# Patient Record
Sex: Female | Born: 1970 | Hispanic: No | Smoking: Current every day smoker
Health system: Southern US, Community
[De-identification: ages and names within clinical notes are randomized; demographics above are authoritative.]

## PROBLEM LIST (undated history)

## (undated) DIAGNOSIS — Z30430 Encounter for insertion of intrauterine contraceptive device: Secondary | ICD-10-CM

## (undated) DIAGNOSIS — F17200 Nicotine dependence, unspecified, uncomplicated: Secondary | ICD-10-CM

## (undated) HISTORY — PX: INTRAUTERINE DEVICE (IUD) INSERTION: SHX5877

## (undated) HISTORY — DX: Encounter for insertion of intrauterine contraceptive device: Z30.430

## (undated) HISTORY — DX: Nicotine dependence, unspecified, uncomplicated: F17.200

## (undated) HISTORY — PX: OTHER SURGICAL HISTORY: SHX169

---

## 2006-03-19 ENCOUNTER — Other Ambulatory Visit: Admission: RE | Admit: 2006-03-19 | Discharge: 2006-03-19 | Payer: Self-pay | Admitting: Obstetrics and Gynecology

## 2007-02-08 ENCOUNTER — Other Ambulatory Visit: Admission: RE | Admit: 2007-02-08 | Discharge: 2007-02-08 | Payer: Self-pay | Admitting: Obstetrics & Gynecology

## 2008-02-09 ENCOUNTER — Other Ambulatory Visit: Admission: RE | Admit: 2008-02-09 | Discharge: 2008-02-09 | Payer: Self-pay | Admitting: Obstetrics and Gynecology

## 2009-02-13 ENCOUNTER — Other Ambulatory Visit: Admission: RE | Admit: 2009-02-13 | Discharge: 2009-02-13 | Payer: Self-pay | Admitting: Obstetrics & Gynecology

## 2011-12-12 DIAGNOSIS — Z30430 Encounter for insertion of intrauterine contraceptive device: Secondary | ICD-10-CM

## 2011-12-12 HISTORY — DX: Encounter for insertion of intrauterine contraceptive device: Z30.430

## 2013-05-02 ENCOUNTER — Ambulatory Visit (INDEPENDENT_AMBULATORY_CARE_PROVIDER_SITE_OTHER): Payer: No Typology Code available for payment source | Admitting: Gynecology

## 2013-05-02 ENCOUNTER — Encounter: Payer: Self-pay | Admitting: Nurse Practitioner

## 2013-05-02 VITALS — BP 104/68 | Resp 14 | Wt 173.0 lb

## 2013-05-02 DIAGNOSIS — F17200 Nicotine dependence, unspecified, uncomplicated: Secondary | ICD-10-CM | POA: Insufficient documentation

## 2013-05-02 DIAGNOSIS — N949 Unspecified condition associated with female genital organs and menstrual cycle: Secondary | ICD-10-CM

## 2013-05-02 DIAGNOSIS — B373 Candidiasis of vulva and vagina: Secondary | ICD-10-CM

## 2013-05-02 DIAGNOSIS — Z975 Presence of (intrauterine) contraceptive device: Secondary | ICD-10-CM

## 2013-05-02 DIAGNOSIS — R102 Pelvic and perineal pain: Secondary | ICD-10-CM

## 2013-05-02 LAB — POCT URINALYSIS DIPSTICK

## 2013-05-02 LAB — POCT WET PREP (WET MOUNT): Clue Cells Wet Prep Whiff POC: NEGATIVE

## 2013-05-02 MED ORDER — FLUCONAZOLE 150 MG PO TABS: 150.0000 mg | ORAL_TABLET | Freq: Once | ORAL | Status: DC

## 2013-05-02 NOTE — Progress Notes (Signed)
Subjective:     Patient ID: Natasha Meza, female   DOB: 10-Dec-1970, 42 y.o.   MRN: [id number]  HPI Comments: Pt present with 2 month history of itching, burning and urinary frequency.  Pt reports treated with water, cranberry supplements and ibuprofen, symptoms would wax and wane.  Pt reports generalized malaise and fatigue.  Pt reports pain with wiping around urethra.  Denies hematuria.  Reports vaginal discharge-yellowish, thick, but no odor.  Reports pain with deep penetration and vaginal dryness.  Pt with Mirena in place, no menses.  Current partner 14m, small break in between, no condoms.      Review of Systems Per hpi    Objective:   Physical Exam  Constitutional: She is oriented to person, place, and time. She appears well-developed and well-nourished.  Genitourinary:     Neurological: She is alert and oriented to person, place, and time.   Pelvic: External genitalia:  See image, white discharge noted on minora              Urethra:  normal appearing urethra with no masses, tenderness or lesions              Bartholins and Skenes: normal                 Vagina:scant thick white discharge              Cervix: normal appearance, strings seen                    Bimanual Exam:  Uterus:  uterus is normal size, shape, consistency and nontender                                      Adnexa: normal adnexa in size, nontender and no masses                                     WP done                                  Assessment:     Yeast vaginitis, vulvitis     Plan:     Fluconazole and topical antifungals Pelvic hygiene reviewed rto in 2w

## 2013-05-02 NOTE — Addendum Note (Signed)
Addended by: Lorraine Lax on: 05/02/2013 03:35 PM   Modules accepted: Orders

## 2013-05-02 NOTE — Patient Instructions (Addendum)
Avoid harsh detergents and rough tp cotton underwear, no thongs, cetaphil liquid-facial soap-no underwear at night.  clomiitrazole externally, do sitz baths with aveeno pour over, blow dry on cool setting after bathing

## 2013-05-04 ENCOUNTER — Telehealth: Payer: Self-pay | Admitting: *Deleted

## 2013-05-04 NOTE — Telephone Encounter (Signed)
Left Message To Call Back  

## 2013-05-04 NOTE — Telephone Encounter (Signed)
Pt notified (see lab)

## 2013-05-04 NOTE — Telephone Encounter (Signed)
Message copied by Lorraine Lax on Wed May 04, 2013 11:43 AM ------      Message from: Douglass Rivers      Created: Wed May 04, 2013  8:34 AM       No uti ------

## 2013-05-04 NOTE — Telephone Encounter (Signed)
Returning you call

## 2013-05-16 ENCOUNTER — Ambulatory Visit: Payer: No Typology Code available for payment source | Admitting: Gynecology

## 2013-12-12 ENCOUNTER — Encounter: Payer: Self-pay | Admitting: Certified Nurse Midwife

## 2013-12-12 ENCOUNTER — Ambulatory Visit (INDEPENDENT_AMBULATORY_CARE_PROVIDER_SITE_OTHER): Payer: No Typology Code available for payment source | Admitting: Certified Nurse Midwife

## 2013-12-12 VITALS — BP 104/60 | HR 68 | Resp 16 | Ht 64.75 in | Wt 166.0 lb

## 2013-12-12 DIAGNOSIS — B373 Candidiasis of vulva and vagina: Secondary | ICD-10-CM

## 2013-12-12 DIAGNOSIS — Z01419 Encounter for gynecological examination (general) (routine) without abnormal findings: Secondary | ICD-10-CM

## 2013-12-12 DIAGNOSIS — B3731 Acute candidiasis of vulva and vagina: Secondary | ICD-10-CM

## 2013-12-12 DIAGNOSIS — Z0000 Encounter for general adult medical examination without abnormal findings: Secondary | ICD-10-CM

## 2013-12-12 LAB — POCT URINALYSIS DIPSTICK
Bilirubin, UA: NEGATIVE
GLUCOSE UA: NEGATIVE
Ketones, UA: NEGATIVE
Leukocytes, UA: NEGATIVE
NITRITE UA: NEGATIVE
PH UA: 5
PROTEIN UA: NEGATIVE
RBC UA: NEGATIVE
UROBILINOGEN UA: NEGATIVE

## 2013-12-12 MED ORDER — NYSTATIN-TRIAMCINOLONE 100000-0.1 UNIT/GM-% EX CREA
1.0000 | TOPICAL_CREAM | Freq: Two times a day (BID) | CUTANEOUS | Status: AC
Start: 2013-12-12 — End: ?

## 2013-12-12 MED ORDER — FLUCONAZOLE 150 MG PO TABS: ORAL_TABLET | ORAL | Status: AC

## 2013-12-12 NOTE — Patient Instructions (Addendum)
EXERCISE AND DIET:  We recommended that you start or continue a regular exercise program for good health. Regular exercise means any activity that makes your heart beat faster and makes you sweat.  We recommend exercising at least 30 minutes per day at least 3 days a week, preferably 4 or 5.  We also recommend a diet low in fat and sugar.  Inactivity, poor dietary choices and obesity can cause diabetes, heart attack, stroke, and kidney damage, among others.    ALCOHOL AND SMOKING:  Women should limit their alcohol intake to no more than 7 drinks/beers/glasses of wine (combined, not each!) per week. Moderation of alcohol intake to this level decreases your risk of breast cancer and liver damage. And of course, no recreational drugs are part of a healthy lifestyle.  And absolutely no smoking or even second hand smoke. Most people know smoking can cause heart and lung diseases, but did you know it also contributes to weakening of your bones? Aging of your skin?  Yellowing of your teeth and nails?  CALCIUM AND VITAMIN D:  Adequate intake of calcium and Vitamin D are recommended.  The recommendations for exact amounts of these supplements seem to change often, but generally speaking 600 mg of calcium (either carbonate or citrate) and 800 units of Vitamin D per day seems prudent. Certain women may benefit from higher intake of Vitamin D.  If you are among these women, your doctor will have told you during your visit.    PAP SMEARS:  Pap smears, to check for cervical cancer or precancers,  have traditionally been done yearly, although recent scientific advances have shown that most women can have pap smears less often.  However, every woman still should have a physical exam from her gynecologist every year. It will include a breast check, inspection of the vulva and vagina to check for abnormal growths or skin changes, a visual exam of the cervix, and then an exam to evaluate the size and shape of the uterus and  ovaries.  And after 42 years of age, a rectal exam is indicated to check for rectal cancers. We will also provide age appropriate advice regarding health maintenance, like when you should have certain vaccines, screening for sexually transmitted diseases, bone density testing, colonoscopy, mammograms, etc.   MAMMOGRAMS:  All women over 40 years old should have a yearly mammogram. Many facilities now offer a "3D" mammogram, which may cost around $50 extra out of pocket. If possible,  we recommend you accept the option to have the 3D mammogram performed.  It both reduces the number of women who will be called back for extra views which then turn out to be normal, and it is better than the routine mammogram at detecting truly abnormal areas.    COLONOSCOPY:  Colonoscopy to screen for colon cancer is recommended for all women at age 50.  We know, you hate the idea of the prep.  We agree, BUT, having colon cancer and not knowing it is worse!!  Colon cancer so often starts as a polyp that can be seen and removed at colonscopy, which can quite literally save your life!  And if your first colonoscopy is normal and you have no family history of colon cancer, most women don't have to have it again for 10 years.  Once every ten years, you can do something that may end up saving your life, right?  We will be happy to help you get it scheduled when you are ready.    Be sure to check your insurance coverage so you understand how much it will cost.  It may be covered as a preventative service at no cost, but you should check your particular policy.     Yeast Infection of the Skin Some yeast on the skin is normal, but sometimes it causes an infection. If you have a yeast infection, it shows up as white or light brown patches on brown skin. You can see it better in the summer on tan skin. It causes light-colored holes in your suntan. It can happen on any area of the body. This cannot be passed from person to person. HOME  CARE  Scrub your skin daily with a dandruff shampoo. Your rash may take a couple weeks to get well.  Do not scratch or itch the rash. GET HELP RIGHT AWAY IF:   You get another infection from scratching. The skin may get warm, red, and may ooze fluid.  The infection does not seem to be getting better. MAKE SURE YOU:  Understand these instructions.  Will watch your condition.  Will get help right away if you are not doing well or get worse. Document Released: 10/30/2008 Document Revised: 02/09/2012 Document Reviewed: 10/30/2008 [employer] Patient Information 2014 [employer].  

## 2013-12-12 NOTE — Progress Notes (Signed)
43 y.o. G13P0102 Divorced Caucasian Fe here for annual exam. Periods occasional scant spotting with IUD. No cramping with IUD. Complaining of breast tenderness at times, but no skin change or nipple discharge. Patient drinks 2 cups of caffeine/day only.. No partner change or STD screening needed. Complaining of vaginal itching with clear scant discharge, used new body wash one week ago which is when symptoms began. Denies rash or bumps. Denies odor or bleeding with sexual activity. Sees urgent care if needed. Declines any labs today. No other health issues.  Patient's last menstrual period was 12/02/2011.          Sexually active: yes  The current method of family planning is IUD.    Exercising: no  exercise Smoker:  yes  Health Maintenance: Pap:  10-19-12 neg MMG:  none Colonoscopy:  none BMD:   none TDaP:  2011 Labs: Poct urine-neg Self breast exam: done occ   reports that she has been smoking.  She does not have any smokeless tobacco history on file. She reports that she drinks alcohol. She reports that she does not use illicit drugs.  Past Medical History  Diagnosis Date  . Smoker   . Encounter for insertion of mirena IUD 12/12/11    Past Surgical History  Procedure Laterality Date  . Intrauterine device (iud) insertion      mirena inserted 12-12-11 due out 12-11-16    Current Outpatient Prescriptions  Medication Sig Dispense Refill  . levonorgestrel (MIRENA) 20 MCG/24HR IUD 1 each by Intrauterine route once.       No current facility-administered medications for this visit.    Family History  Problem Relation Age of Onset  . Diabetes Maternal Grandfather   . Heart disease Maternal Grandfather     quadruple bypass  . Stroke Maternal Grandfather   . Lung cancer Paternal Grandmother     ROS:  Pertinent items are noted in HPI.  Otherwise, a comprehensive ROS was negative.  Exam:   BP 104/60  Pulse 68  Resp 16  Ht 5' 4.75" (1.645 m)  Wt 166 lb (75.297 kg)  BMI 27.83  kg/m2  LMP 12/02/2011 Height: 5' 4.75" (164.5 cm)  Ht Readings from Last 3 Encounters:  12/12/13 5' 4.75" (1.645 m)    General appearance: alert, cooperative and appears stated age Head: Normocephalic, without obvious abnormality, atraumatic Neck: no adenopathy, supple, symmetrical, trachea midline and thyroid normal to inspection and palpation Lungs: clear to auscultation bilaterally Breasts: normal appearance, no masses or tenderness, No nipple retraction or dimpling, No nipple discharge or bleeding, No axillary or supraclavicular adenopathy, non tender to touch Heart: regular rate and rhythm Abdomen: soft, non-tender; no masses,  no organomegaly Extremities: extremities normal, atraumatic, no cyanosis or edema Skin: Skin color, texture, turgor normal. No rashes or lesions Lymph nodes: Cervical, supraclavicular, and axillary nodes normal. No abnormal inguinal nodes palpated Neurologic: Grossly normal   Pelvic: External genitalia:  no lesions, increase pink with scaling and white exudate, no lesions. Wet prep taken              Urethra:  normal appearing urethra with no masses, tenderness or lesions              Bartholin's and Skene's: normal                 Vagina: normal appearing vagina with normal color and scant white discharge, no lesions ph 4.0, wet prep taken  Cervix: normal, non tender, IUD string noted              Pap taken: yes Bimanual Exam:  Uterus:  normal size, contour, position, consistency, mobility, non-tender and anteverted              Adnexa: normal adnexa and no mass, fullness, tenderness               Rectovaginal: Confirms               Anus:  normal sphincter tone, no lesions  Wet Prep: positive for yeast vaginally and on vulva area  A:  Well Woman with normal exam  Contraception Mirena IUD   Yeast vaginitis/vulvitis  Breast Tenderness normal breast exam, mammogram due, probably ovulatory or menstrual related  P:   Reviewed health and  wellness pertinent to exam  Removal Due 12/11/16, aware of warning signs with use  Reviewed findings of yeast. Comfort measures given  Rx Diflucan see order  Rx Mycolog see order  Discussed monitor occurrence and advise  Stressed importance of scheduling first mammogram, information given to schedule  Pap smear as per guidelines   mammogram pap smear taken today with HPVHR  counseled on breast self exam, mammography screening, STD prevention, adequate intake of calcium and vitamin D, diet and exercise  return annually or prn  An After Visit Summary was printed and given to the patient.

## 2013-12-13 NOTE — Progress Notes (Signed)
Reviewed personally.  M. Suzanne Aayra Hornbaker, MD.  

## 2013-12-14 LAB — IPS PAP TEST WITH HPV

## 2014-02-13 ENCOUNTER — Telehealth: Payer: Self-pay | Admitting: Certified Nurse Midwife

## 2014-02-13 NOTE — Telephone Encounter (Signed)
Patient has some billing questions

## 2014-06-14 ENCOUNTER — Encounter: Payer: Self-pay | Admitting: Gynecology

## 2014-10-02 ENCOUNTER — Encounter: Payer: Self-pay | Admitting: Certified Nurse Midwife

## 2017-08-07 ENCOUNTER — Encounter (HOSPITAL_COMMUNITY): Payer: Self-pay | Admitting: *Deleted

## 2017-08-07 ENCOUNTER — Emergency Department (HOSPITAL_COMMUNITY): Payer: Worker's Compensation

## 2017-08-07 ENCOUNTER — Emergency Department (HOSPITAL_COMMUNITY)
Admission: EM | Admit: 2017-08-07 | Discharge: 2017-08-07 | Disposition: A | Discharge status: Home / Self Care | Payer: Worker's Compensation | Attending: Emergency Medicine | Admitting: Emergency Medicine

## 2017-08-07 DIAGNOSIS — Z79899 Other long term (current) drug therapy: Secondary | ICD-10-CM | POA: Insufficient documentation

## 2017-08-07 DIAGNOSIS — Y9289 Other specified places as the place of occurrence of the external cause: Secondary | ICD-10-CM | POA: Insufficient documentation

## 2017-08-07 DIAGNOSIS — S62639A Displaced fracture of distal phalanx of unspecified finger, initial encounter for closed fracture: Secondary | ICD-10-CM | POA: Diagnosis not present

## 2017-08-07 DIAGNOSIS — W230XXA Caught, crushed, jammed, or pinched between moving objects, initial encounter: Secondary | ICD-10-CM | POA: Insufficient documentation

## 2017-08-07 DIAGNOSIS — S6992XA Unspecified injury of left wrist, hand and finger(s), initial encounter: Secondary | ICD-10-CM | POA: Diagnosis present

## 2017-08-07 DIAGNOSIS — Y990 Civilian activity done for income or pay: Secondary | ICD-10-CM | POA: Insufficient documentation

## 2017-08-07 DIAGNOSIS — F17200 Nicotine dependence, unspecified, uncomplicated: Secondary | ICD-10-CM | POA: Diagnosis not present

## 2017-08-07 DIAGNOSIS — Y939 Activity, unspecified: Secondary | ICD-10-CM | POA: Insufficient documentation

## 2017-08-07 MED ORDER — IBUPROFEN 200 MG PO TABS
600.0000 mg | ORAL_TABLET | Freq: Once | ORAL | Status: DC
  Filled 2017-08-07: qty 3

## 2017-08-07 NOTE — ED Triage Notes (Signed)
Pt reports slamming her L ring finger between a door frame and a heavy door at work.  Swelling and redness with bruising noted.

## 2017-08-07 NOTE — Discharge Instructions (Signed)
Please take Ibuprofen (Advil, motrin) and Tylenol (acetaminophen) to relieve your pain.  You may take up to 600 MG (3 pills) of normal strength ibuprofen every 8 hours as needed.  In between doses of ibuprofen you make take tylenol, up to 1,000 mg (two extra strength pills).  Do not take more than 3,000 mg tylenol in a 24 hour period.  Please check all medication labels as many medications such as pain and cold medications may contain tylenol.  Do not drink alcohol while taking these medications.  Do not take other NSAID'S while taking ibuprofen (such as aleve or naproxen).  Please take ibuprofen with food to decrease stomach upset.  Please wear your splint until told you can stop by your doctor.

## 2017-08-07 NOTE — ED Notes (Signed)
Motrin was not give to pt as ordered, pt reported taking her own motrin.  Lanora ManisElizabeth EDPA made aware

## 2017-08-07 NOTE — ED Provider Notes (Signed)
[facility]-EMERGENCY DEPT Provider Note   CSN: [id number] Arrival date & time: 08/07/17  1423     History   Chief Complaint Chief Complaint  Patient presents with  . Finger Injury    HPI Natasha Meza is a 46 y.o. female who presents today for evaluation of left ring finger pain. She reports that she had her finger accidentally shut in a heavy door at work.  She denies any numbness or tingling in her finger, does endorse pain, has not yet taken anything for her pain.  HPI  Past Medical History:  Diagnosis Date  . Encounter for insertion of mirena IUD 12/12/11  . Smoker     Patient Active Problem List   Diagnosis Date Noted  . IUD (intrauterine device) in place 05/02/2013  . Smoker 05/02/2013    Past Surgical History:  Procedure Laterality Date  . c-section    . INTRAUTERINE DEVICE (IUD) INSERTION     mirena inserted 12-12-11 due out 12-11-16    OB History    Gravida Para Term Preterm AB Living   1 1   1   2    SAB TAB Ectopic Multiple Live Births         1 2       Home Medications    Prior to Admission medications   Medication Sig Start Date End Date Taking? Authorizing Provider  fluconazole (DIFLUCAN) 150 MG tablet Take one tablet.  Repeat one in 5 days 12/12/13   Verner CholLeonard, Deborah S, CNM  levonorgestrel [facility](MIRENA) 20 MCG/24HR IUD 1 each by Intrauterine route once.    [provider]  nystatin-triamcinolone (MYCOLOG II) cream Apply 1 application topically 2 (two) times daily. Apply to affected area BID for up to 7 days. 12/12/13   Verner CholLeonard, Deborah S, CNM    Family History Family History  Problem Relation Age of Onset  . Diabetes Maternal Grandfather   . Heart disease Maternal Grandfather        quadruple bypass  . Stroke Maternal Grandfather   . Lung cancer Paternal Grandmother     Social History Social History  Substance Use Topics  . Smoking status: Current Every Day Smoker    Packs/day: 1.00  . Smokeless tobacco: Never Used  . Alcohol use Yes       Comment: 1-2 a month     Allergies   Codeine   Review of Systems Review of Systems  Musculoskeletal:       Pain in her distal left ring finger.   Skin: Positive for color change. Negative for wound.  Hematological: Does not bruise/bleed easily.     Physical Exam Updated Vital Signs BP 114/71 (BP Location: Right Arm)   Pulse 92   Temp 98.9 F (37.2 C) (Oral)   Resp 16   Ht 5' 4.5" (1.638 m)   Wt 83.9 kg (185 lb)   SpO2 99%   BMI 31.26 kg/m   Physical Exam  Constitutional: She appears well-developed and well-nourished.  HENT:  Head: Normocephalic and atraumatic.  Musculoskeletal:  Very small partial subungual hematoma to left ring finger nail.  There is obvious ecchymosis and mild edema to distal left ring finger. Patient has intact sensation, circulation, and motor function although states that it is painful to move her finger.  Neurological: She is alert.  Skin: Skin is warm and dry. She is not diaphoretic.  Psychiatric: She has a normal mood and affect. Her behavior is normal.  Nursing note and vitals reviewed.    ED  Treatments / Results  Labs (all labs ordered are listed, but only abnormal results are displayed) Labs Reviewed - No data to display  EKG  EKG Interpretation None       Radiology Dg Finger Ring Left  Result Date: 08/07/2017 CLINICAL DATA:  Pt reports slamming her L ring finger between a door frame and a heavy door at work today. Bruising noted to nail bed. EXAM: LEFT RING FINGER 2+V COMPARISON:  None. FINDINGS: There is a comminuted fracture of the tuft of the fourth digit. The articular surface of the distal phalanx is intact. IMPRESSION: Comminuted fracture of the distal tuft. Electronically Signed   By: Natasha Meza M.D.   On: 08/07/2017 16:33    Procedures Procedures (including critical care time)  Medications Ordered in ED Medications  ibuprofen (ADVIL,MOTRIN) tablet 600 mg (not administered)     Initial Impression /  Assessment and Plan / ED Course  I have reviewed the triage vital signs and the nursing notes.  Pertinent labs & imaging results that were available during my care of the patient were reviewed by me and considered in my medical decision making (see chart for details).    Natasha Meza presents for evaluation of acute pain to her left distal ring finger after it was shut in a door at work. X-rays were obtained which revealed a comminuted distal tuft fracture. She does not have any obvious wounds to the skin in the area of her fracture.  Very small subungual hematoma with out evidence of oozing of blood around the nail.  I feel confident that this is a closed fracture.  She was given a splint and instructed to follow up with her PCP.  Narcotic pain medication was considered, however she reports allergy to codine and declined norco or percocet.  She was given instructions on alternating ibuprofen and tylenol.   Final Clinical Impressions(s) / ED Diagnoses   Final diagnoses:  Closed fracture of tuft of distal phalanx of finger    New Prescriptions New Prescriptions   No medications on file     Norman Clay 08/07/17 1711    Tilden Fossa, MD 08/08/17 1031

## 2017-08-07 NOTE — ED Notes (Signed)
Bed: WTR7 Expected date:  Expected time:  Means of arrival:  Comments: 

## 2018-10-20 IMAGING — CR DG FINGER RING 2+V*L*
3 series · 3 of 3 positions shown · non-contrast
Comparison: None.

CLINICAL DATA: Pt reports slamming her L ring finger between a door
frame and a heavy door at work today. Bruising noted to nail bed.

EXAM:
LEFT RING FINGER 2+V

[x finger pa left]
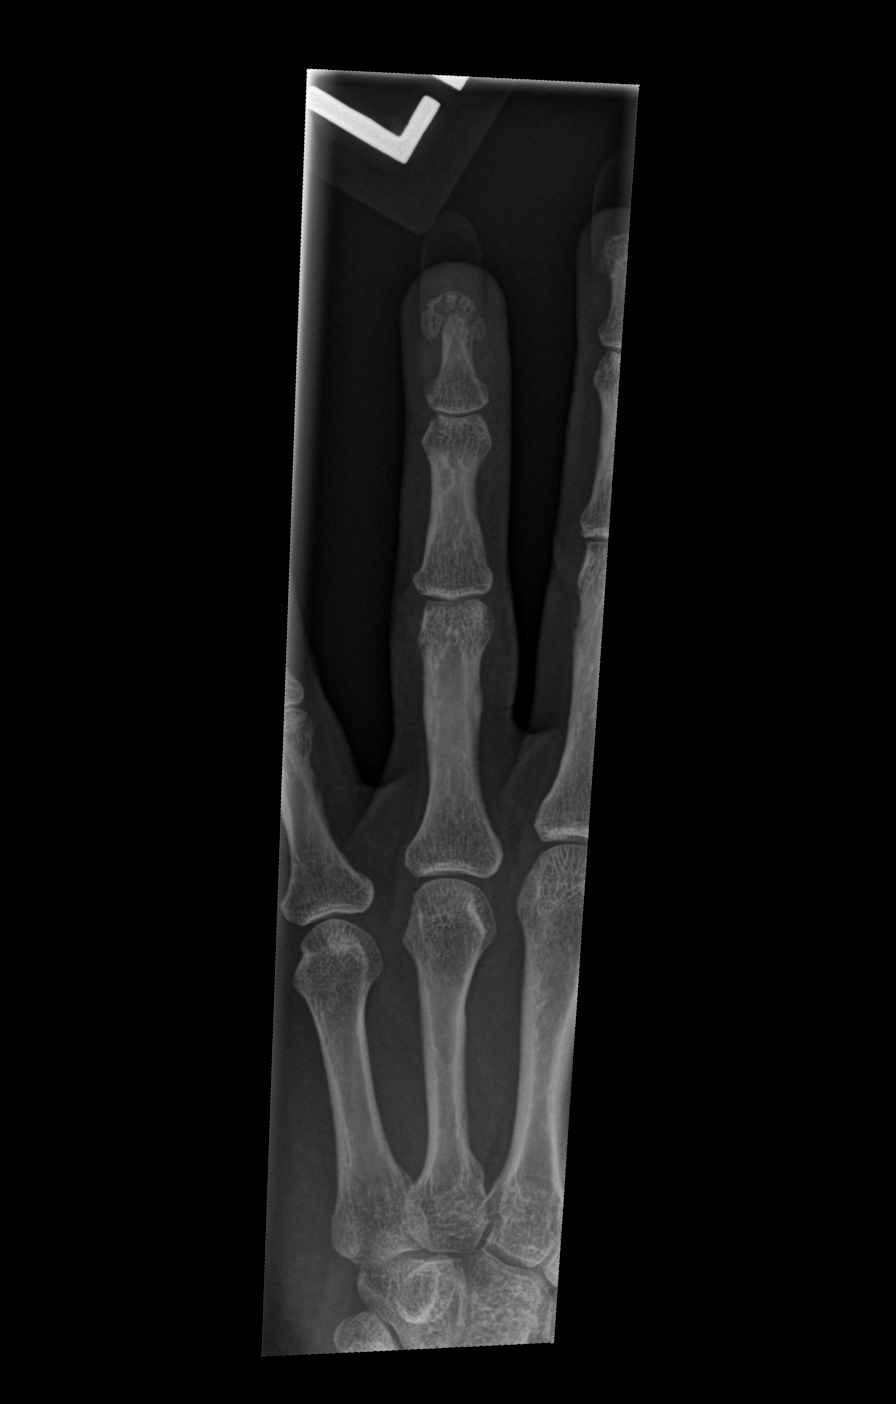

[x finger obl left]
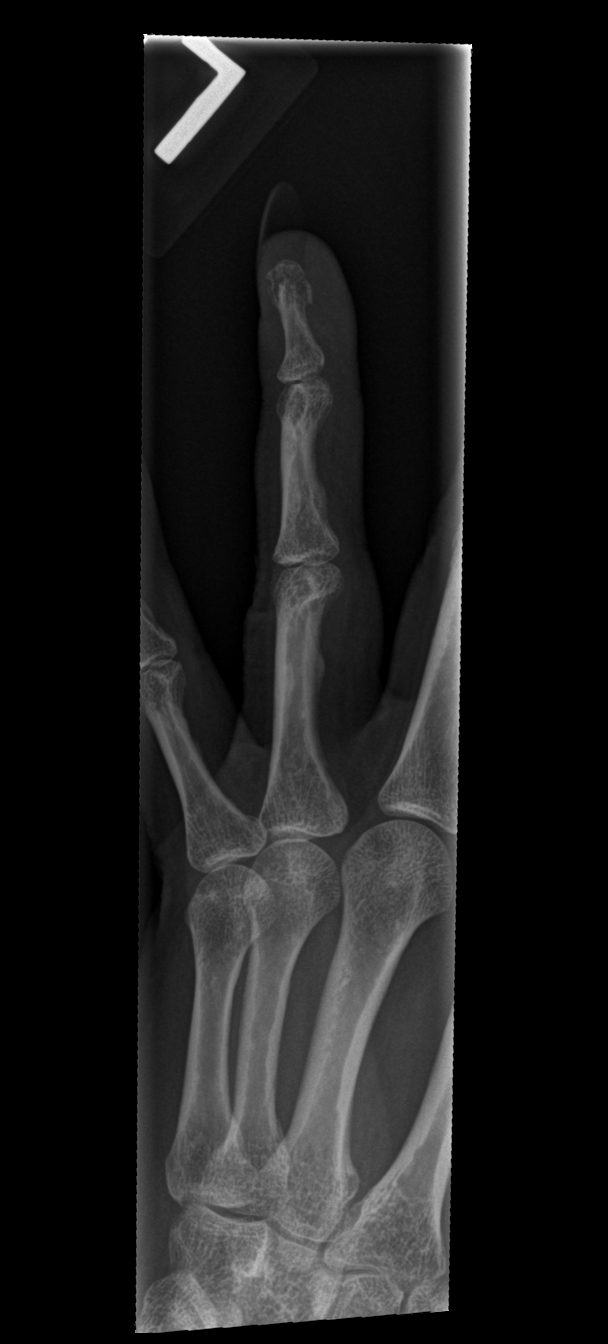

[x finger lat left]
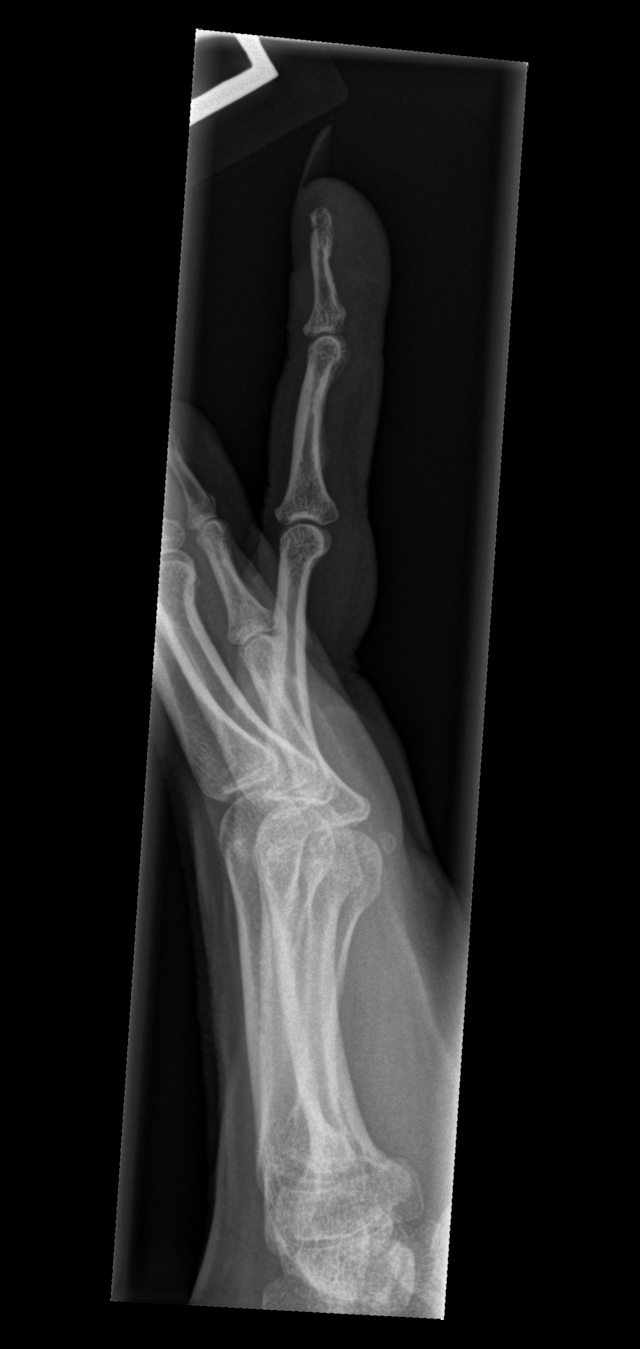

[3 of 3 positions shown; findings below may reference images not displayed]

FINDINGS: There is a comminuted fracture of the tuft of the fourth digit. The
articular surface of the distal phalanx is intact.
IMPRESSION: Comminuted fracture of the distal tuft.
